# Patient Record
Sex: Female | Born: 1975 | Race: White | Hispanic: No | Marital: Married | Smoking: Never smoker
Health system: Southern US, Community
[De-identification: ages and names within clinical notes are randomized; demographics above are authoritative.]

---

## 2000-12-08 ENCOUNTER — Other Ambulatory Visit: Admission: RE | Admit: 2000-12-08 | Discharge: 2000-12-08 | Payer: Self-pay | Admitting: *Deleted

## 2001-12-09 ENCOUNTER — Other Ambulatory Visit: Admission: RE | Admit: 2001-12-09 | Discharge: 2001-12-09 | Payer: Self-pay | Admitting: Family Medicine

## 2002-12-29 ENCOUNTER — Other Ambulatory Visit: Admission: RE | Admit: 2002-12-29 | Discharge: 2002-12-29 | Payer: Self-pay | Admitting: Family Medicine

## 2004-01-04 ENCOUNTER — Other Ambulatory Visit: Admission: RE | Admit: 2004-01-04 | Discharge: 2004-01-04 | Payer: Self-pay | Admitting: Family Medicine

## 2005-01-16 ENCOUNTER — Other Ambulatory Visit: Admission: RE | Admit: 2005-01-16 | Discharge: 2005-01-16 | Payer: Self-pay | Admitting: Family Medicine

## 2006-01-23 ENCOUNTER — Other Ambulatory Visit: Admission: RE | Admit: 2006-01-23 | Discharge: 2006-01-23 | Payer: Self-pay | Admitting: Family Medicine

## 2011-04-05 ENCOUNTER — Inpatient Hospital Stay (HOSPITAL_COMMUNITY)
Admission: AD | Admit: 2011-04-05 | Payer: BC Managed Care – PPO | Source: Ambulatory Visit | Admitting: Obstetrics and Gynecology

## 2011-04-07 ENCOUNTER — Encounter (HOSPITAL_COMMUNITY): Payer: Self-pay | Admitting: *Deleted

## 2011-04-07 ENCOUNTER — Inpatient Hospital Stay (HOSPITAL_COMMUNITY)
Admission: AD | Admit: 2011-04-07 | Discharge: 2011-04-10 | DRG: 373 | Disposition: A | Discharge status: Home / Self Care | Payer: BC Managed Care – PPO | Source: Ambulatory Visit | Attending: Obstetrics and Gynecology | Admitting: Obstetrics and Gynecology

## 2011-04-07 LAB — COMPREHENSIVE METABOLIC PANEL
ALT: 14 U/L (ref 0–35)
Albumin: 2.5 g/dL — ABNORMAL LOW (ref 3.5–5.2)
Alkaline Phosphatase: 114 U/L (ref 39–117)
BUN: 5 mg/dL — ABNORMAL LOW (ref 6–23)
Chloride: 99 mEq/L (ref 96–112)
Potassium: 3.3 mEq/L — ABNORMAL LOW (ref 3.5–5.1)
Sodium: 129 mEq/L — ABNORMAL LOW (ref 135–145)
Total Bilirubin: 0.3 mg/dL (ref 0.3–1.2)
Total Protein: 5.8 g/dL — ABNORMAL LOW (ref 6.0–8.3)

## 2011-04-07 LAB — CBC
HCT: 38.5 % (ref 36.0–46.0)
MCV: 88.7 fL (ref 78.0–100.0)
Platelets: 228 10*3/uL (ref 150–400)
RBC: 4.34 MIL/uL (ref 3.87–5.11)
WBC: 12.8 10*3/uL — ABNORMAL HIGH (ref 4.0–10.5)

## 2011-04-07 LAB — URIC ACID: Uric Acid, Serum: 4 mg/dL (ref 2.4–7.0)

## 2011-04-09 LAB — CBC
HCT: 32.6 % — ABNORMAL LOW (ref 36.0–46.0)
Hemoglobin: 10.7 g/dL — ABNORMAL LOW (ref 12.0–15.0)
MCH: 29.6 pg (ref 26.0–34.0)
MCV: 90.1 fL (ref 78.0–100.0)
Platelets: 221 10*3/uL (ref 150–400)
RBC: 3.62 MIL/uL — ABNORMAL LOW (ref 3.87–5.11)
WBC: 20.7 10*3/uL — ABNORMAL HIGH (ref 4.0–10.5)

## 2011-04-11 ENCOUNTER — Inpatient Hospital Stay (HOSPITAL_COMMUNITY): Admit: 2011-04-11 | Payer: BC Managed Care – PPO | Admitting: Obstetrics & Gynecology

## 2013-02-28 ENCOUNTER — Ambulatory Visit (INDEPENDENT_AMBULATORY_CARE_PROVIDER_SITE_OTHER): Payer: BC Managed Care – PPO | Admitting: Family Medicine

## 2013-02-28 ENCOUNTER — Telehealth: Payer: Self-pay | Admitting: Nurse Practitioner

## 2013-02-28 ENCOUNTER — Encounter: Payer: Self-pay | Admitting: Family Medicine

## 2013-02-28 VITALS — BP 139/91 | HR 90 | Temp 98.8°F | Ht 64.0 in | Wt 134.0 lb

## 2013-02-28 DIAGNOSIS — J029 Acute pharyngitis, unspecified: Secondary | ICD-10-CM

## 2013-02-28 MED ORDER — AZITHROMYCIN 250 MG PO TABS: ORAL_TABLET | ORAL | Status: AC

## 2013-02-28 NOTE — Telephone Encounter (Signed)
APPT MADE

## 2013-02-28 NOTE — Progress Notes (Signed)
  Subjective:     Audrey Schroeder is a 37 y.o. female who presents for evaluation of sore throat. Associated symptoms include nasal blockage, clear nasal discharge, post nasal drip, sinus and nasal congestion and sore throat. Onset of symptoms was 3 days ago, and have been gradually worsening since that time. She is drinking plenty of fluids. She has had a recent close exposure to someone with proven streptococcal pharyngitis.her 2 children.  The following portions of the patient's history were reviewed and updated as appropriate: allergies, current medications, past family history, past medical history, past social history, past surgical history and problem list.  Review of Systems A comprehensive review of systems was negative.    Objective:    BP 139/91  Pulse 90  Temp(Src) 98.8 F (37.1 C) (Oral)  Ht 5\' 4"  (1.626 m)  Wt 134 lb (60.782 kg)  BMI 22.99 kg/m2  LMP 02/07/2013  Breastfeeding? Unknown General:  alert, cooperative, appears stated age and no distress  Mouth:  lips, mucosa, and tongue normal; teeth and gums normal  Neck: mild anterior cervical adenopathy, no adenopathy, no carotid bruit, no JVD, supple, symmetrical, trachea midline and thyroid not enlarged, symmetric, no tenderness/mass/nodules.  Throat / oropharynx was very red, no exudartes.  Laboratory Strep test not done.    Assessment:     Acute Pharyngitis, likely  Strep throat   From history of exposure  allergy to Penicillin.  Plan:    Patient placed on antibiotics.  Z Pack. Contagiousness discussed. Risk of rheumatic fever discussed. OOW for 24 hours while on antibiotics.

## 2014-04-21 ENCOUNTER — Ambulatory Visit (INDEPENDENT_AMBULATORY_CARE_PROVIDER_SITE_OTHER): Payer: BC Managed Care – PPO | Admitting: Family Medicine

## 2014-04-21 ENCOUNTER — Encounter: Payer: Self-pay | Admitting: Family Medicine

## 2014-04-21 VITALS — BP 138/78 | HR 69 | Temp 98.3°F | Ht 64.0 in | Wt 142.4 lb

## 2014-04-21 DIAGNOSIS — J029 Acute pharyngitis, unspecified: Secondary | ICD-10-CM

## 2014-04-21 DIAGNOSIS — T148 Other injury of unspecified body region: Secondary | ICD-10-CM

## 2014-04-21 DIAGNOSIS — W57XXXA Bitten or stung by nonvenomous insect and other nonvenomous arthropods, initial encounter: Secondary | ICD-10-CM

## 2014-04-21 LAB — POCT RAPID STREP A (OFFICE): Rapid Strep A Screen: NEGATIVE

## 2014-04-21 MED ORDER — DOXYCYCLINE HYCLATE 100 MG PO TABS
100.0000 mg | ORAL_TABLET | Freq: Two times a day (BID) | ORAL | Status: AC
Start: 2014-04-21 — End: ?

## 2014-04-21 NOTE — Progress Notes (Signed)
   Subjective:    Patient ID: Audrey Schroeder, female    DOB: August 19, 1976, 38 y.o.   MRN: [id number]  HPI This 38 y.o. female presents for evaluation of myalgias, arthralgias, headaches, malaise, And feeling like she has the flu.  She did find a tick a month ago in her groin.   Review of Systems No chest pain, SOB, HA, dizziness, vision change, N/V, diarrhea, constipation, dysuria, urinary urgency or frequency, myalgias, arthralgias or rash.     Objective:   Physical Exam  Vital signs noted  Well developed well nourished female.  HEENT - Head atraumatic Normocephalic                Eyes - PERRLA, Conjuctiva - clear Sclera- Clear EOMI                Ears - EAC's Wnl TM's Wnl Gross Hearing WNL                Throat - oropharanx wnl Respiratory - Lungs CTA bilateral Cardiac - RRR S1 and S2 without murmur GI - Abdomen soft Nontender and bowel sounds active x 4 Extremities - No edema. Neuro - Grossly intact.      Assessment & Plan:  Sore throat - Plan: POCT rapid strep A, Strep negative  Tick bite - Plan: [place] spotted fvr abs pnl(IgG+IgM), Lyme Ab/Western Blot Reflex, doxycycline (VIBRA-TABS) 100 MG tablet  Deatra Canter FNP

## 2014-04-24 ENCOUNTER — Encounter: Payer: Self-pay | Admitting: Family Medicine

## 2014-04-25 ENCOUNTER — Encounter: Payer: Self-pay | Admitting: Family Medicine

## 2014-04-25 LAB — LYME AB/WESTERN BLOT REFLEX
LYME DISEASE AB, QUANT, IGM: <0.8 index (ref 0.00–0.79)
Lyme IgG/IgM Ab: <0.91 {ISR} (ref 0.00–0.90)

## 2014-04-25 LAB — ROCKY MTN SPOTTED FVR ABS PNL(IGG+IGM)
RMSF IgG: NEGATIVE
RMSF IgM: 0.22 index (ref 0.00–0.89)

## 2014-04-26 ENCOUNTER — Encounter: Payer: Self-pay | Admitting: Family Medicine

## 2014-09-25 ENCOUNTER — Encounter: Payer: Self-pay | Admitting: Family Medicine

## 2017-08-10 ENCOUNTER — Other Ambulatory Visit: Payer: Self-pay | Admitting: Obstetrics and Gynecology

## 2017-08-10 DIAGNOSIS — R928 Other abnormal and inconclusive findings on diagnostic imaging of breast: Secondary | ICD-10-CM

## 2017-08-13 ENCOUNTER — Ambulatory Visit
Admission: RE | Admit: 2017-08-13 | Discharge: 2017-08-13 | Disposition: A | Discharge status: Home / Self Care | Payer: BC Managed Care – PPO | Source: Ambulatory Visit | Attending: Obstetrics and Gynecology | Admitting: Obstetrics and Gynecology

## 2017-08-13 ENCOUNTER — Other Ambulatory Visit: Payer: Self-pay | Admitting: Obstetrics and Gynecology

## 2017-08-13 DIAGNOSIS — R928 Other abnormal and inconclusive findings on diagnostic imaging of breast: Secondary | ICD-10-CM

## 2017-08-13 DIAGNOSIS — N6310 Unspecified lump in the right breast, unspecified quadrant: Secondary | ICD-10-CM

## 2017-08-17 ENCOUNTER — Other Ambulatory Visit: Payer: BC Managed Care – PPO

## 2018-02-10 ENCOUNTER — Other Ambulatory Visit: Payer: Self-pay | Admitting: Obstetrics and Gynecology

## 2018-02-10 ENCOUNTER — Ambulatory Visit
Admission: RE | Admit: 2018-02-10 | Discharge: 2018-02-10 | Disposition: A | Discharge status: Home / Self Care | Payer: BC Managed Care – PPO | Source: Ambulatory Visit | Attending: Obstetrics and Gynecology | Admitting: Obstetrics and Gynecology

## 2018-02-10 DIAGNOSIS — N6310 Unspecified lump in the right breast, unspecified quadrant: Secondary | ICD-10-CM

## 2018-02-15 ENCOUNTER — Ambulatory Visit
Admission: RE | Admit: 2018-02-15 | Discharge: 2018-02-15 | Disposition: A | Discharge status: Home / Self Care | Payer: BC Managed Care – PPO | Source: Ambulatory Visit | Attending: Obstetrics and Gynecology | Admitting: Obstetrics and Gynecology

## 2018-02-15 DIAGNOSIS — N6310 Unspecified lump in the right breast, unspecified quadrant: Secondary | ICD-10-CM

## 2021-09-27 ENCOUNTER — Other Ambulatory Visit: Payer: Self-pay | Admitting: Obstetrics and Gynecology

## 2021-09-27 DIAGNOSIS — R928 Other abnormal and inconclusive findings on diagnostic imaging of breast: Secondary | ICD-10-CM

## 2021-10-12 ENCOUNTER — Ambulatory Visit
Admission: RE | Admit: 2021-10-12 | Discharge: 2021-10-12 | Disposition: A | Discharge status: Home / Self Care | Payer: BC Managed Care – PPO | Source: Ambulatory Visit | Attending: Obstetrics and Gynecology | Admitting: Obstetrics and Gynecology

## 2021-10-12 ENCOUNTER — Other Ambulatory Visit: Payer: Self-pay

## 2021-10-12 DIAGNOSIS — R928 Other abnormal and inconclusive findings on diagnostic imaging of breast: Secondary | ICD-10-CM

## 2022-01-26 IMAGING — MG MM DIGITAL DIAGNOSTIC UNILAT*L* W/ TOMO W/ CAD
8 series · 8 of 24 positions shown · non-contrast
Comparison: Previous exam(s).

CLINICAL DATA: Patient returns after screening study for evaluation
of possible LEFT breast mass.

EXAM:
DIGITAL DIAGNOSTIC UNILATERAL LEFT MAMMOGRAM WITH TOMOSYNTHESIS AND
CAD; ULTRASOUND LEFT BREAST LIMITED
TECHNIQUE: Left digital diagnostic mammography and breast tomosynthesis was
performed. The images were evaluated with computer-aided detection.;
Targeted ultrasound examination of the left breast was performed.

[L ML synth-2D]
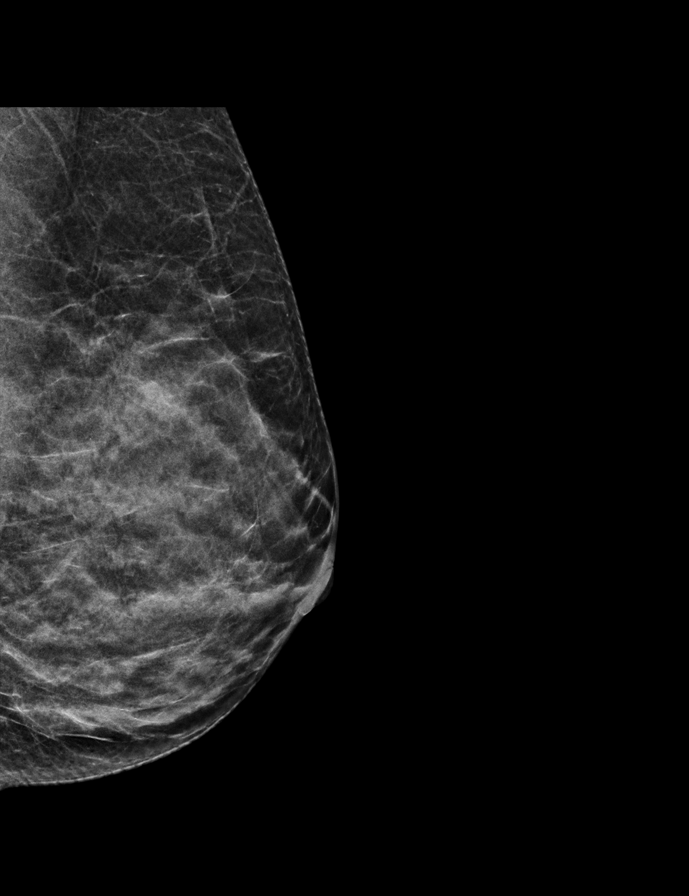

[L MLO synth-2D (1 of 2)]
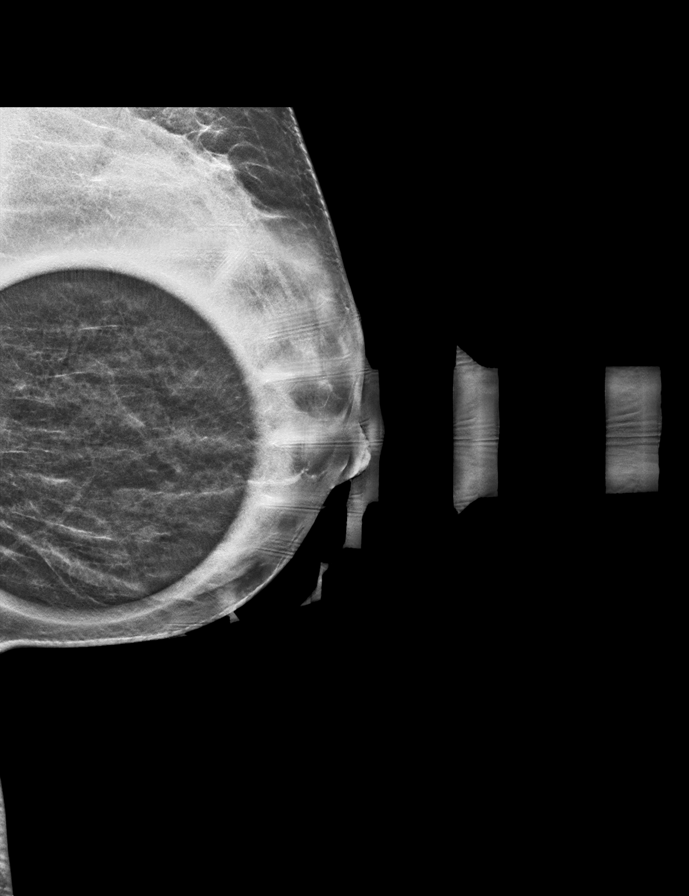

[L CC synth-2D]
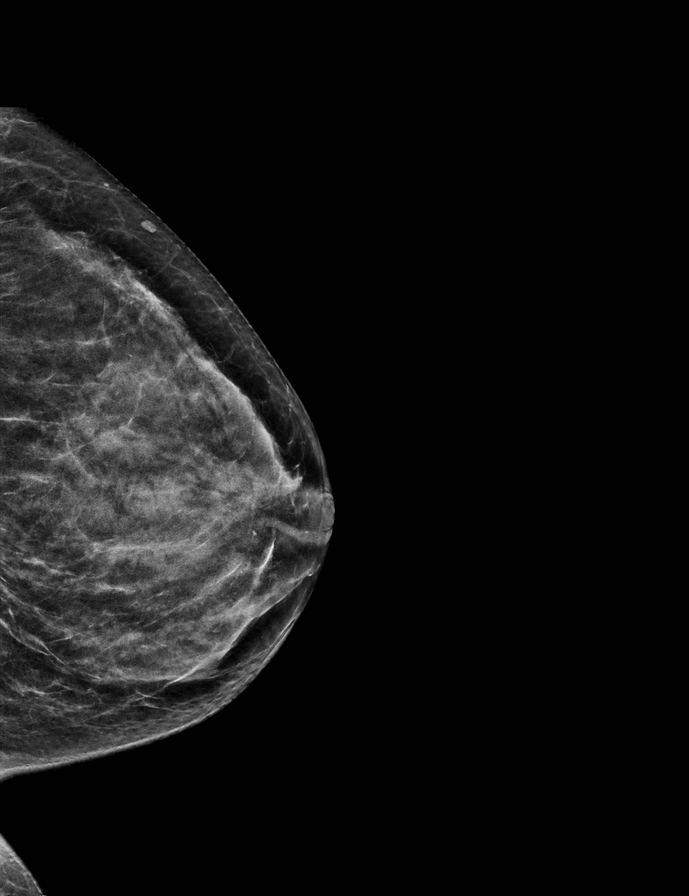

[L MLO synth-2D (2 of 2)]
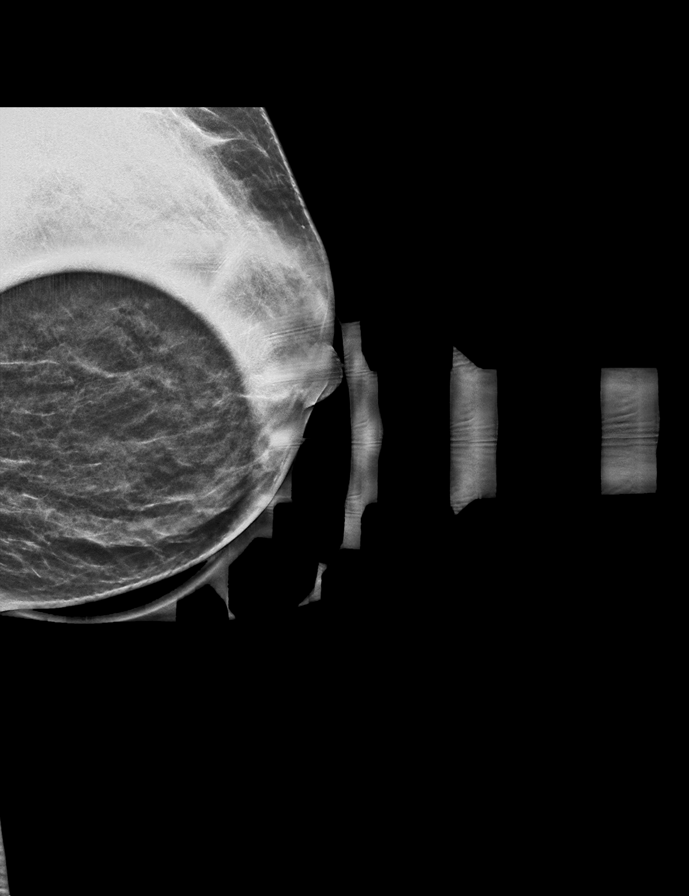

[L CC tomo · tomo slice 23/46.0]
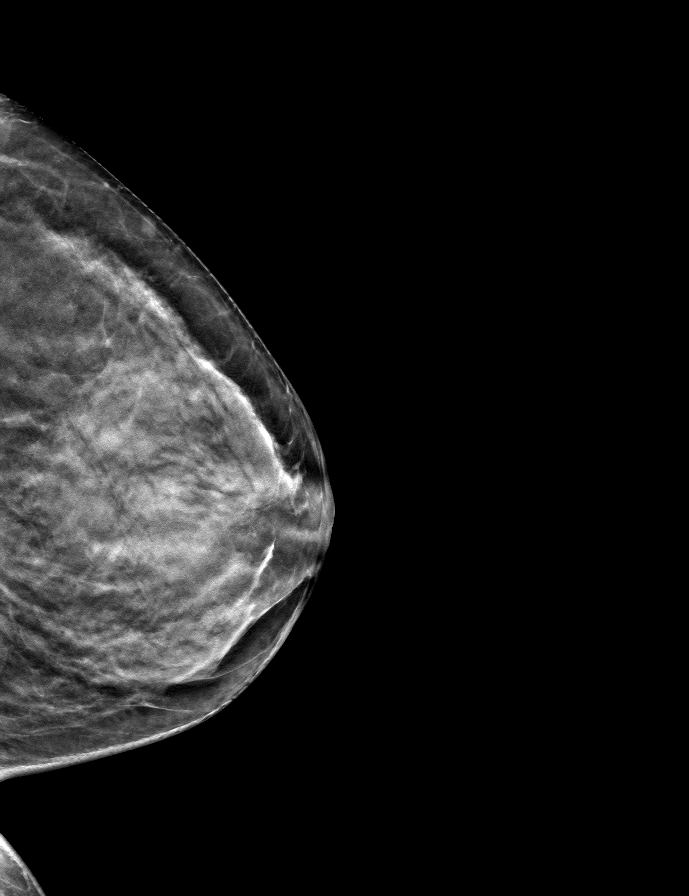

[L MLO tomo (1 of 2) · tomo slice 19/36.0]
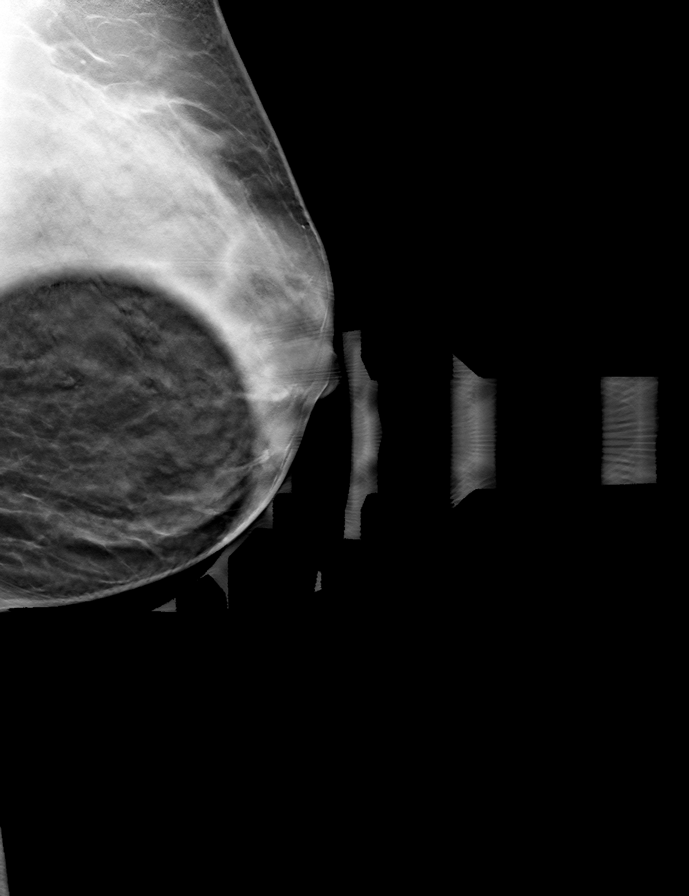

[L MLO tomo (2 of 2) · tomo slice 21/40.0]
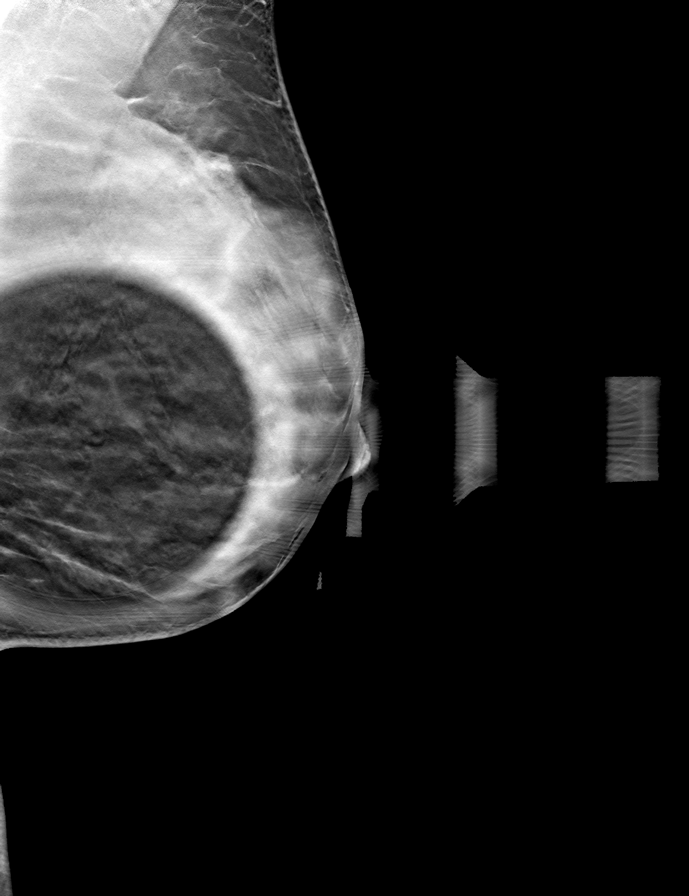

[L ML tomo · tomo slice 21/42.0]
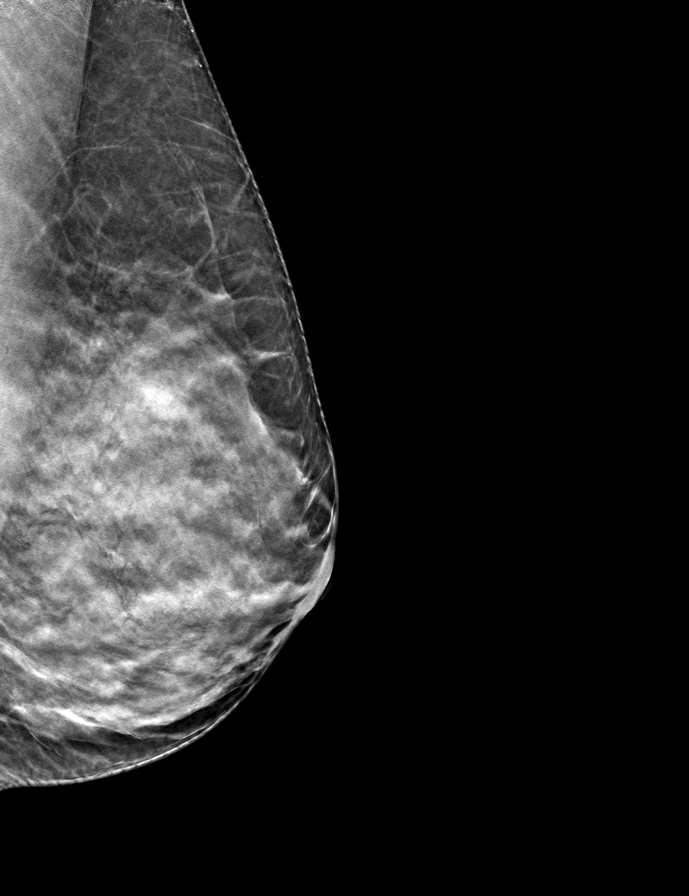

[8 of 24 positions shown; findings below may reference images not displayed]

ACR Breast Density Category d: The breast tissue is extremely dense,
which lowers the sensitivity of mammography.
FINDINGS: Additional 2-D and 3-D images are performed. These views confirm
presence of a partially obscured mass in the posterior central
portion of the LEFT breast.

On physical exam, I palpate no abnormality in the central aspect of
the LEFT breast.

Targeted ultrasound is performed, showing a simple cyst in the 3
o'clock retroareolar region of the LEFT breast which measures 1.8 x
0.8 x 1.9 centimeters. No solid mass or areas of acoustic shadowing.
IMPRESSION: Simple cyst in the LEFT breast. No mammographic or ultrasound
evidence for malignancy.

RECOMMENDATION:
Screening mammogram in one year.(Code:YL-2-TTI)

I have discussed the findings and recommendations with the patient.
If applicable, a reminder letter will be sent to the patient
regarding the next appointment.

BI-RADS CATEGORY  2: Benign.

## 2022-01-26 IMAGING — US US BREAST*L* LIMITED INC AXILLA
1 series · 7 of 7 positions shown · non-contrast
Comparison: Previous exam(s).

CLINICAL DATA: Patient returns after screening study for evaluation
of possible LEFT breast mass.

EXAM:
DIGITAL DIAGNOSTIC UNILATERAL LEFT MAMMOGRAM WITH TOMOSYNTHESIS AND
CAD; ULTRASOUND LEFT BREAST LIMITED
TECHNIQUE: Left digital diagnostic mammography and breast tomosynthesis was
performed. The images were evaluated with computer-aided detection.;
Targeted ultrasound examination of the left breast was performed.

[Series 1: us breast*left* limited inc axilla · 0.06mm/px · 7 of 7 slices shown]
[im 1/7]
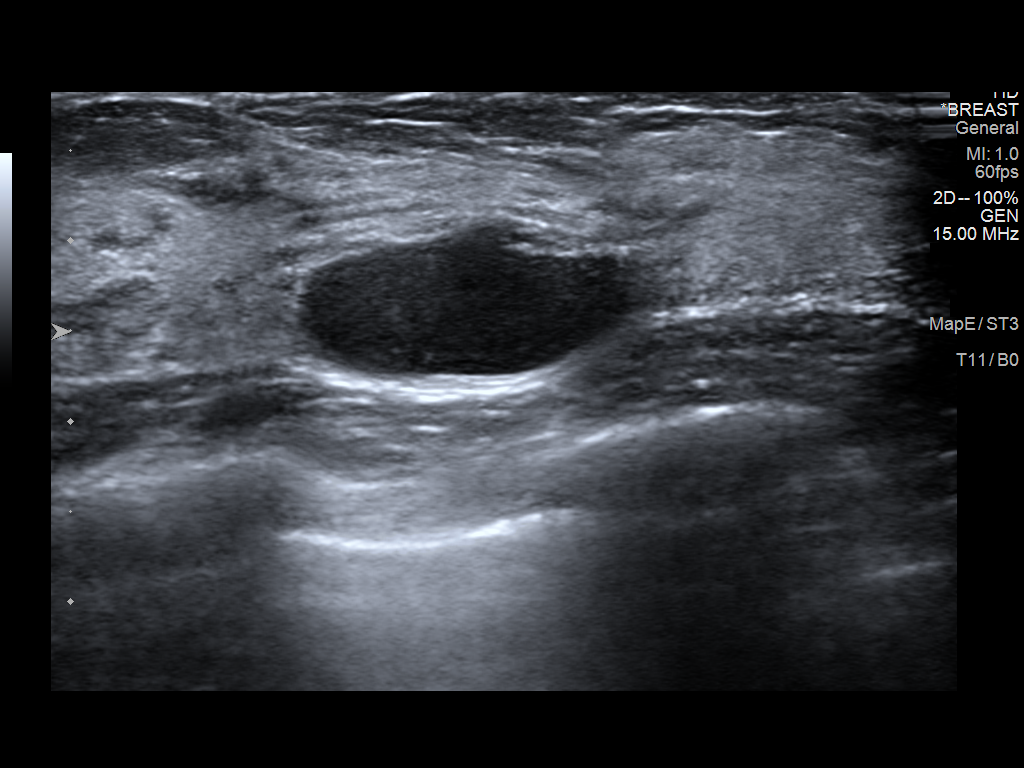
[im 2/7]
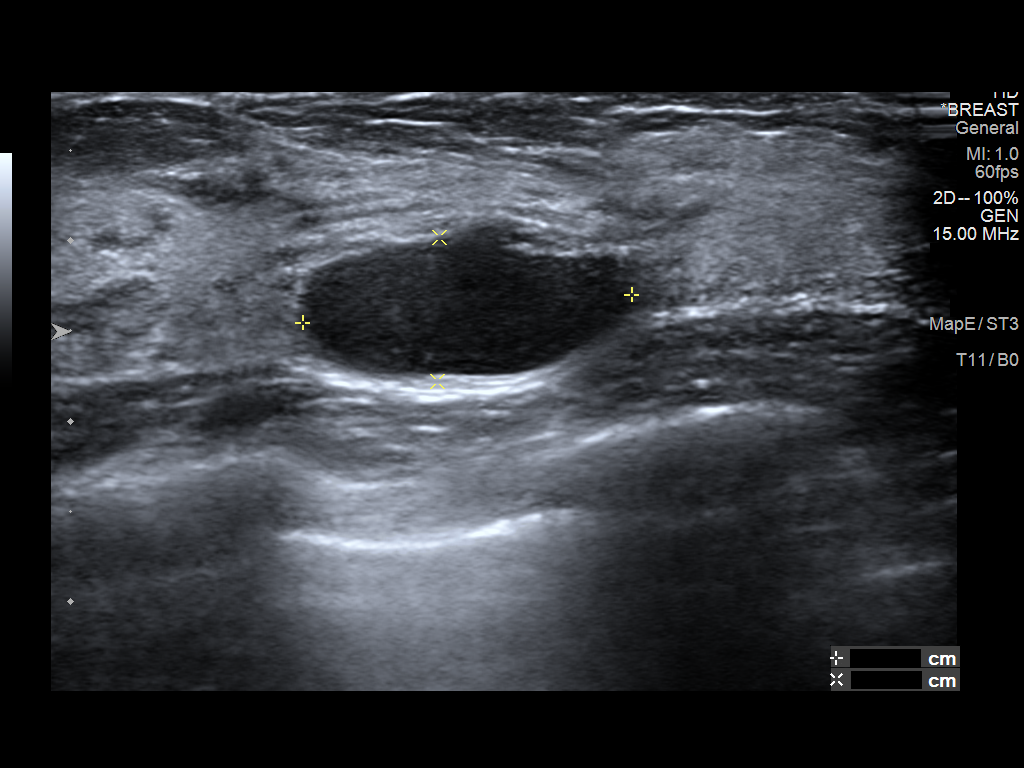
[im 3/7]
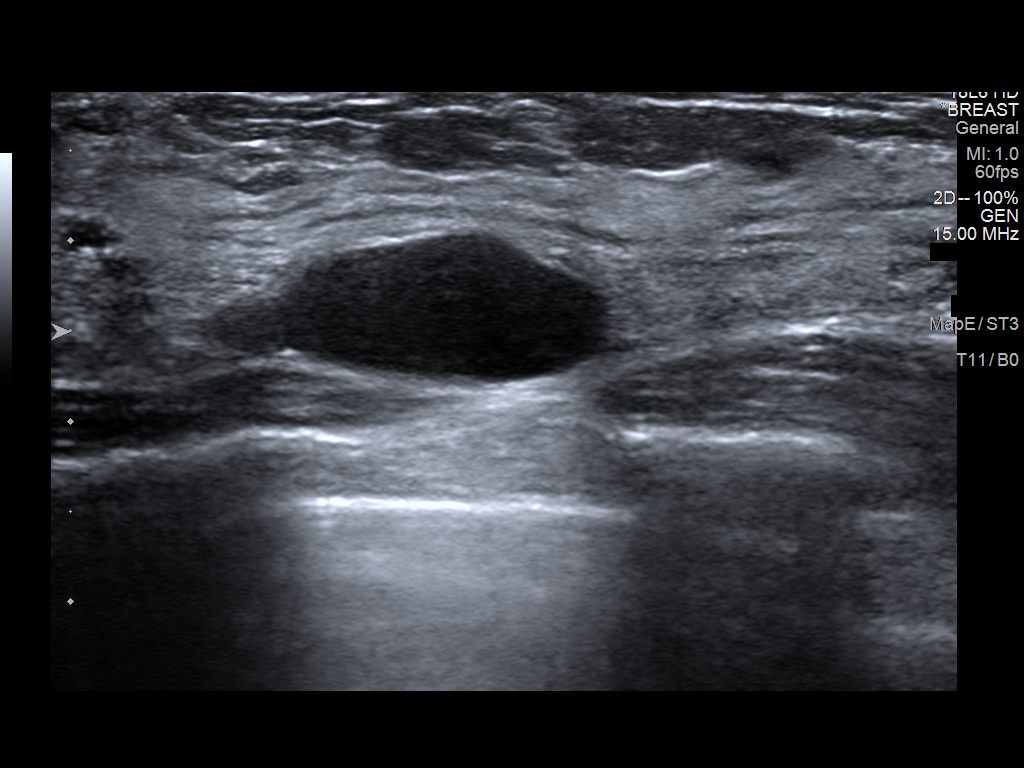
[im 4/7]
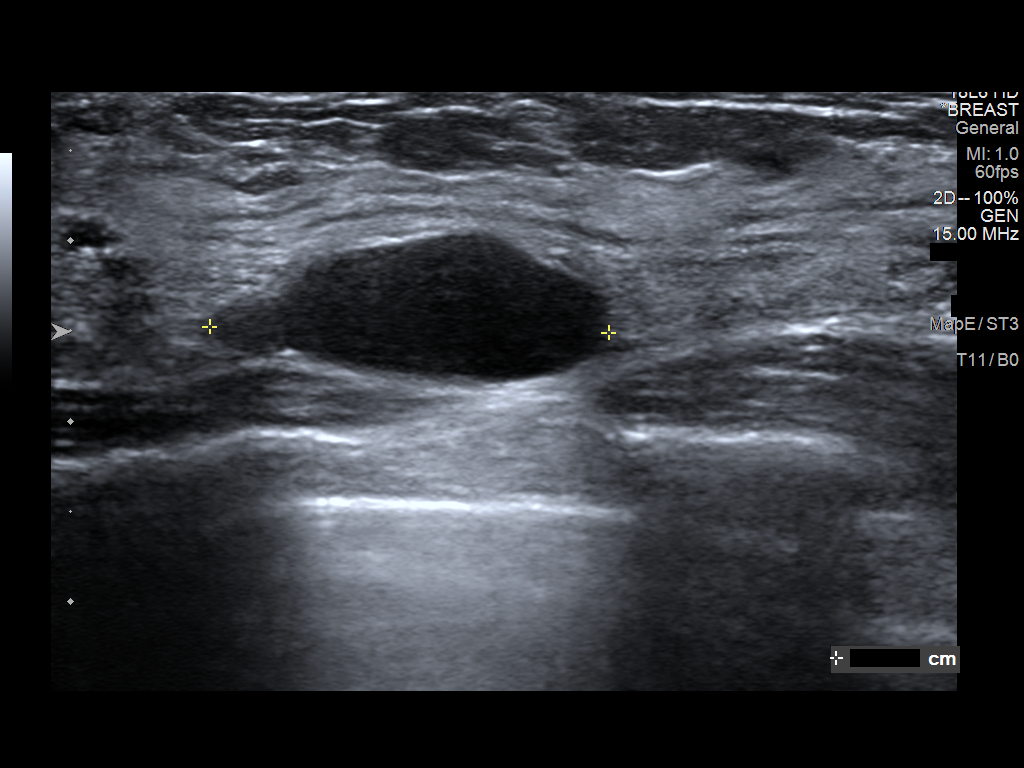
[im 5/7]
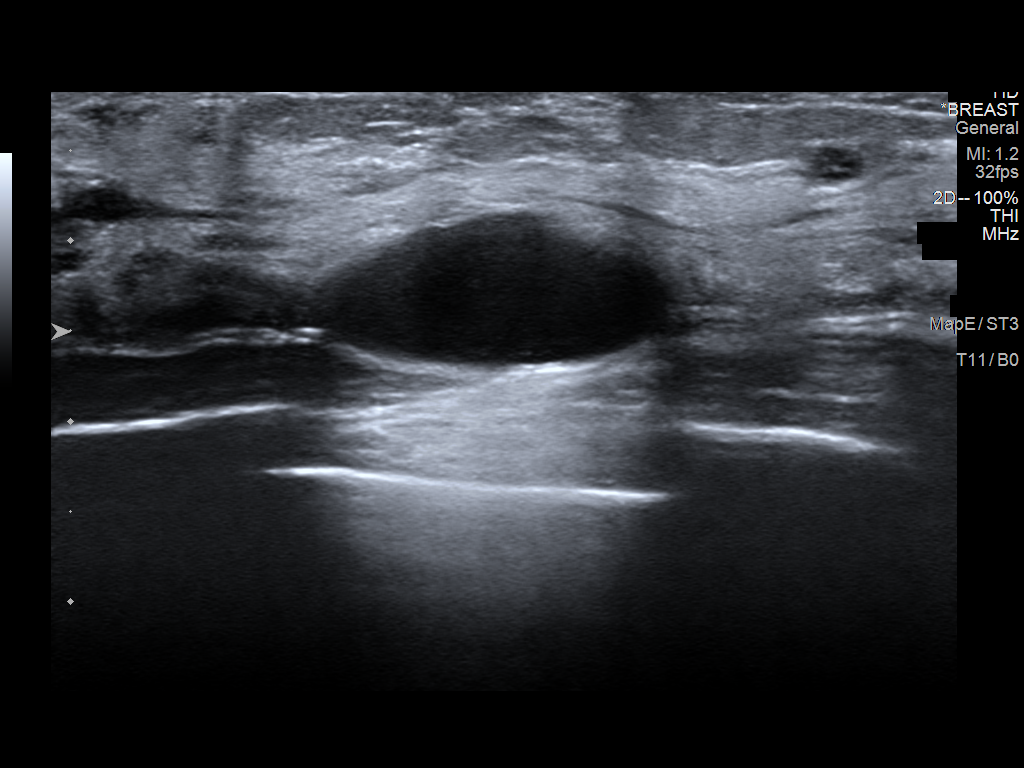
[im 6/7]
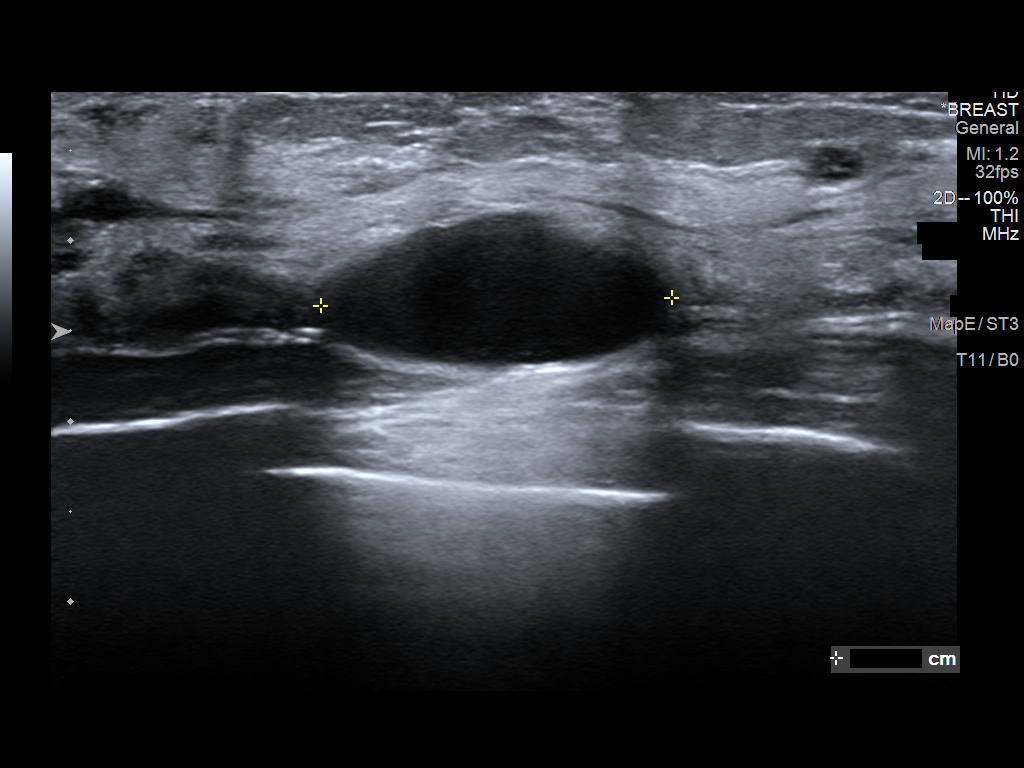
[im 7/7]
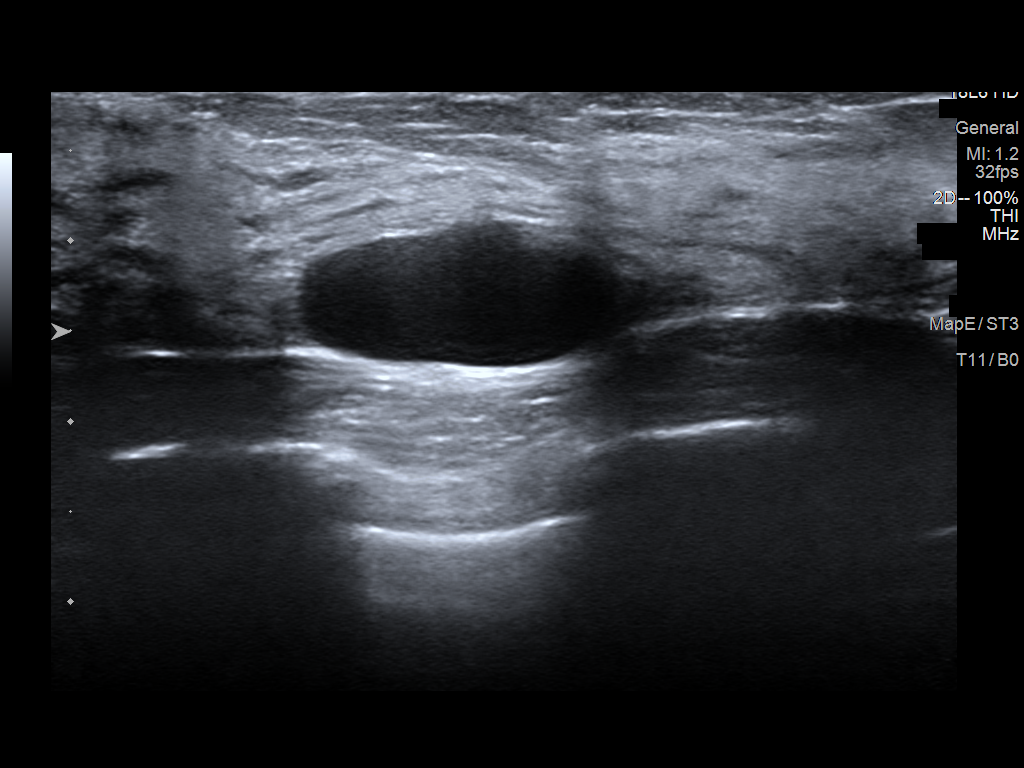

[7 of 7 positions shown; findings below may reference images not displayed]

ACR Breast Density Category d: The breast tissue is extremely dense,
which lowers the sensitivity of mammography.
FINDINGS: Additional 2-D and 3-D images are performed. These views confirm
presence of a partially obscured mass in the posterior central
portion of the LEFT breast.

On physical exam, I palpate no abnormality in the central aspect of
the LEFT breast.

Targeted ultrasound is performed, showing a simple cyst in the 3
o'clock retroareolar region of the LEFT breast which measures 1.8 x
0.8 x 1.9 centimeters. No solid mass or areas of acoustic shadowing.
IMPRESSION: Simple cyst in the LEFT breast. No mammographic or ultrasound
evidence for malignancy.

RECOMMENDATION:
Screening mammogram in one year.(Code:YL-2-TTI)

I have discussed the findings and recommendations with the patient.
If applicable, a reminder letter will be sent to the patient
regarding the next appointment.

BI-RADS CATEGORY  2: Benign.
